# Patient Record
Sex: Male | Born: 1985 | Race: White | Hispanic: No | Marital: Married | Smoking: Never smoker
Health system: Southern US, Community
[De-identification: ages and names within clinical notes are randomized; demographics above are authoritative.]

## PROBLEM LIST (undated history)

## (undated) DIAGNOSIS — I4891 Unspecified atrial fibrillation: Secondary | ICD-10-CM

## (undated) DIAGNOSIS — S069X9A Unspecified intracranial injury with loss of consciousness of unspecified duration, initial encounter: Secondary | ICD-10-CM

## (undated) DIAGNOSIS — F4310 Post-traumatic stress disorder, unspecified: Secondary | ICD-10-CM

## (undated) HISTORY — PX: SHOULDER SURGERY: SHX246

---

## 2003-02-06 DIAGNOSIS — S069XAA Unspecified intracranial injury with loss of consciousness status unknown, initial encounter: Secondary | ICD-10-CM

## 2003-02-06 DIAGNOSIS — S069X9A Unspecified intracranial injury with loss of consciousness of unspecified duration, initial encounter: Secondary | ICD-10-CM

## 2003-02-06 HISTORY — DX: Unspecified intracranial injury with loss of consciousness of unspecified duration, initial encounter: S06.9X9A

## 2003-02-06 HISTORY — DX: Unspecified intracranial injury with loss of consciousness status unknown, initial encounter: S06.9XAA

## 2010-09-09 ENCOUNTER — Emergency Department (HOSPITAL_COMMUNITY): Payer: Self-pay

## 2010-09-09 ENCOUNTER — Encounter: Payer: Self-pay | Admitting: *Deleted

## 2010-09-09 ENCOUNTER — Emergency Department (HOSPITAL_COMMUNITY)
Admission: EM | Admit: 2010-09-09 | Discharge: 2010-09-10 | Disposition: A | Discharge status: Home / Self Care | Payer: Self-pay | Attending: Emergency Medicine | Admitting: Emergency Medicine

## 2010-09-09 DIAGNOSIS — Y9239 Other specified sports and athletic area as the place of occurrence of the external cause: Secondary | ICD-10-CM | POA: Insufficient documentation

## 2010-09-09 DIAGNOSIS — X58XXXA Exposure to other specified factors, initial encounter: Secondary | ICD-10-CM | POA: Insufficient documentation

## 2010-09-09 DIAGNOSIS — M67919 Unspecified disorder of synovium and tendon, unspecified shoulder: Secondary | ICD-10-CM | POA: Insufficient documentation

## 2010-09-09 DIAGNOSIS — M719 Bursopathy, unspecified: Secondary | ICD-10-CM | POA: Insufficient documentation

## 2010-09-09 NOTE — ED Notes (Signed)
Pt reports he has had 2 shoulder surgeries in [place], last in 2010, pt was playing baseball tonight and threw a pitch re-injuring his rt shoulder

## 2010-09-10 ENCOUNTER — Encounter (HOSPITAL_COMMUNITY): Payer: Self-pay | Admitting: Emergency Medicine

## 2010-09-10 MED ORDER — OXYCODONE-ACETAMINOPHEN 5-325 MG PO TABS
1.0000 | ORAL_TABLET | Freq: Once | ORAL | Status: AC
  Administered 2010-09-10: 1 via ORAL
  Filled 2010-09-10: qty 1

## 2010-09-10 MED ORDER — HYDROCODONE-ACETAMINOPHEN 5-325 MG PO TABS: ORAL_TABLET | ORAL | Status: AC

## 2010-09-10 MED ORDER — IBUPROFEN 800 MG PO TABS
800.0000 mg | ORAL_TABLET | Freq: Once | ORAL | Status: AC
  Administered 2010-09-10: 800 mg via ORAL
  Filled 2010-09-10: qty 1

## 2010-09-10 MED ORDER — IBUPROFEN 800 MG PO TABS: 800.0000 mg | ORAL_TABLET | Freq: Three times a day (TID) | ORAL | Status: AC

## 2010-09-10 NOTE — ED Provider Notes (Signed)
History     CSN: [id number] Arrival date & time: 09/09/2010 11:20 PM  Chief Complaint  Patient presents with  . Shoulder Injury   HPI Comments: Patient with hx of previous right rotator cuff and labral surgeries x 2.  C/o sudden onset of pain to the right shoulder tonight after he threw a softball.  States he felt a "tearing sensation" to the shoulder that felt similar to previous injury.  Pain is reproduced with abduction and rotation of the shoulder.  He denies numbness, weakness, chest pain or neck pain  Patient is a 25 y.o. male presenting with shoulder injury. The history is provided by the patient.  Shoulder Injury This is a recurrent problem. The problem occurs constantly. The problem has been unchanged. Associated symptoms include arthralgias. Pertinent negatives include no abdominal pain, coughing, fever, headaches, joint swelling, nausea, neck pain, numbness or weakness. The symptoms are aggravated by exertion (movement). He has tried nothing for the symptoms.  Shoulder Injury This is a recurrent problem. The problem occurs constantly. The problem has been unchanged. Pertinent negatives include no abdominal pain and no headaches. The symptoms are aggravated by exertion (movement). He has tried nothing for the symptoms.    History reviewed. No pertinent past medical history.  Past Surgical History  Procedure Date  . Shoulder surgery     History reviewed. No pertinent family history.  History  Substance Use Topics  . Smoking status: Not on file  . Smokeless tobacco: Current User    Types: Chew  . Alcohol Use: No      Review of Systems  Constitutional: Negative for fever and appetite change.  HENT: Negative for neck pain.   Eyes: Negative for visual disturbance.  Respiratory: Negative for cough and chest tightness.   Cardiovascular: Negative.   Gastrointestinal: Negative for nausea and abdominal pain.  Musculoskeletal: Positive for arthralgias. Negative for back pain  and joint swelling.  Skin: Negative.   Neurological: Negative for dizziness, weakness, numbness and headaches.  Hematological: Does not bruise/bleed easily.    Physical Exam  BP 120/78  Pulse 76  Temp(Src) 97.8 F (36.6 C) (Oral)  Resp 20  SpO2 100%  Physical Exam  Nursing note and vitals reviewed. Constitutional: He is oriented to person, place, and time. He appears well-developed and well-nourished. No distress.  HENT:  Head: Normocephalic and atraumatic.  Eyes: EOM are normal. Pupils are equal, round, and reactive to light.  Neck: Normal range of motion. Neck supple.  Cardiovascular: Normal rate, regular rhythm and normal heart sounds.   Pulmonary/Chest: Effort normal and breath sounds normal.  Musculoskeletal: He exhibits tenderness. He exhibits no edema.       Right shoulder: He exhibits decreased range of motion and tenderness. He exhibits no swelling, no effusion, no crepitus and normal pulse.       Arms: Lymphadenopathy:    He has no cervical adenopathy.  Neurological: He is alert and oriented to person, place, and time. No cranial nerve deficit. Coordination normal.  Skin: Skin is warm and dry.  Psychiatric: He has a normal mood and affect.    ED Course  Procedures  MDM   0100  I have reviewed the patient's x-ray results and discussed the findings with the patient.  He prefers to see ortho in [place].  Right arm was placed in a sling by nursing staff.  Neurovascular recheck intact.  Pain improved.  Likely rotator cuff re-injury and possible AC joint separation.      Tammy L. Triplett,  PA 09/10/10 0108 Medical screening examination/treatment/procedure(s) were performed by non-physician practitioner and as supervising physician I was immediately available for consultation/collaboration.  Nicoletta Dress. Colon Branch, MD 09/13/10 1206

## 2013-01-10 ENCOUNTER — Emergency Department (HOSPITAL_COMMUNITY)
Admission: EM | Admit: 2013-01-10 | Discharge: 2013-01-10 | Disposition: A | Discharge status: Home / Self Care | Payer: Self-pay | Attending: Emergency Medicine | Admitting: Emergency Medicine

## 2013-01-10 ENCOUNTER — Encounter (HOSPITAL_COMMUNITY): Payer: Self-pay | Admitting: Emergency Medicine

## 2013-01-10 ENCOUNTER — Emergency Department (HOSPITAL_COMMUNITY): Payer: Self-pay

## 2013-01-10 DIAGNOSIS — S93409A Sprain of unspecified ligament of unspecified ankle, initial encounter: Secondary | ICD-10-CM | POA: Insufficient documentation

## 2013-01-10 DIAGNOSIS — Z8679 Personal history of other diseases of the circulatory system: Secondary | ICD-10-CM | POA: Insufficient documentation

## 2013-01-10 DIAGNOSIS — X500XXA Overexertion from strenuous movement or load, initial encounter: Secondary | ICD-10-CM | POA: Insufficient documentation

## 2013-01-10 DIAGNOSIS — Y9301 Activity, walking, marching and hiking: Secondary | ICD-10-CM | POA: Insufficient documentation

## 2013-01-10 DIAGNOSIS — Y92009 Unspecified place in unspecified non-institutional (private) residence as the place of occurrence of the external cause: Secondary | ICD-10-CM | POA: Insufficient documentation

## 2013-01-10 DIAGNOSIS — Z91040 Latex allergy status: Secondary | ICD-10-CM | POA: Insufficient documentation

## 2013-01-10 DIAGNOSIS — Z87820 Personal history of traumatic brain injury: Secondary | ICD-10-CM | POA: Insufficient documentation

## 2013-01-10 HISTORY — DX: Unspecified intracranial injury with loss of consciousness of unspecified duration, initial encounter: S06.9X9A

## 2013-01-10 HISTORY — DX: Unspecified atrial fibrillation: I48.91

## 2013-01-10 MED ORDER — IBUPROFEN 600 MG PO TABS: 600.0000 mg | ORAL_TABLET | Freq: Four times a day (QID) | ORAL | Status: DC | PRN

## 2013-01-10 MED ORDER — HYDROCODONE-ACETAMINOPHEN 5-325 MG PO TABS
1.0000 | ORAL_TABLET | Freq: Once | ORAL | Status: AC
  Administered 2013-01-10: 1 via ORAL
  Filled 2013-01-10: qty 1

## 2013-01-10 MED ORDER — HYDROCODONE-ACETAMINOPHEN 5-325 MG PO TABS: 1.0000 | ORAL_TABLET | ORAL | Status: DC | PRN

## 2013-01-10 MED ORDER — IBUPROFEN 800 MG PO TABS
800.0000 mg | ORAL_TABLET | Freq: Once | ORAL | Status: AC
  Administered 2013-01-10: 800 mg via ORAL
  Filled 2013-01-10: qty 1

## 2013-01-10 NOTE — ED Notes (Signed)
Twisted right ankle stepping out of an outbuilding.

## 2013-01-10 NOTE — ED Provider Notes (Signed)
CSN: [id number]     Arrival date & time 01/10/13  2124 History   First MD Initiated Contact with Patient 01/10/13 2155     Chief Complaint  Patient presents with  . Ankle Pain   (Consider location/radiation/quality/duration/timing/severity/associated sxs/prior Treatment) HPI Comments: Nicolas Rose is a 27 y.o. Male presenting with  right ankle pain which occurred just prior to arrival when he rolled his ankle when he missed a stepping stone walking out of his outbuilding.  Pain is aching, constant and worse with palpation, movement and weight bearing.  The patient was able to weight bear immediately after the event.  There is no radiation of pain and the patient denies numbness distal to the injury site.  He has had no treatment prior to arrival.  He has a history of 2 prior ankle fractures in this joint.     The history is provided by the patient and the spouse.    Past Medical History  Diagnosis Date  . A-fib   . TBI (traumatic brain injury) 2005   Past Surgical History  Procedure Laterality Date  . Shoulder surgery     No family history on file. History  Substance Use Topics  . Smoking status: Never Smoker   . Smokeless tobacco: Current User    Types: Chew  . Alcohol Use: No    Review of Systems  Musculoskeletal: Positive for arthralgias and joint swelling.  Skin: Negative for wound.  Neurological: Negative for weakness and numbness.    Allergies  Phenergan and Latex  Home Medications   Current Outpatient Rx  Name  Route  Sig  Dispense  Refill  . HYDROcodone-acetaminophen (NORCO/VICODIN) 5-325 MG per tablet   Oral   Take 1 tablet by mouth every 4 (four) hours as needed for moderate pain.   15 tablet   0   . ibuprofen (ADVIL,MOTRIN) 600 MG tablet   Oral   Take 1 tablet (600 mg total) by mouth every 6 (six) hours as needed.   30 tablet   0    BP 112/78  Pulse 118  Temp(Src) 97.9 F (36.6 C) (Oral)  Resp 20  Ht 5\' 4"  (1.626 m)  Wt 172 lb (78.019 kg)   BMI 29.51 kg/m2  SpO2 98% Physical Exam  Nursing note and vitals reviewed. Constitutional: He appears well-developed and well-nourished.  HENT:  Head: Normocephalic.  Cardiovascular: Normal rate and intact distal pulses.  Exam reveals no decreased pulses.   Pulses:      Dorsalis pedis pulses are 2+ on the right side, and 2+ on the left side.       Posterior tibial pulses are 2+ on the right side, and 2+ on the left side.  Musculoskeletal: He exhibits edema and tenderness.       Right ankle: He exhibits decreased range of motion, swelling and ecchymosis. He exhibits normal pulse. Tenderness. Lateral malleolus tenderness found. No head of 5th metatarsal and no proximal fibula tenderness found. Achilles tendon normal.  Neurological: He is alert. No sensory deficit.  Skin: Skin is warm, dry and intact.    ED Course  Procedures (including critical care time) Labs Review Labs Reviewed - No data to display Imaging Review Dg Ankle Complete Right  01/10/2013   CLINICAL DATA:  Lateral right ankle pain and swelling following an injury.  EXAM: RIGHT ANKLE - COMPLETE 3+ VIEW  COMPARISON:  None.  FINDINGS: Avulsion fracture fragments adjacent to the distal aspect of the lateral malleolus. Mild diffuse soft tissue swelling.  No effusion is seen.  IMPRESSION: Probable old fracture fragments adjacent to the distal lateral malleolus. Acute fracture fragments are less likely   Electronically Signed   By: Gordan Payment M.D.   On: 01/10/2013 22:12    EKG Interpretation   None       MDM   1. Ankle sprain and strain, right, initial encounter    Patients labs and/or radiological studies were viewed and considered during the medical decision making and disposition process. ASO,  RICE.  Prescribed ibuprofen, hydrocodone.  [facility]  Currently under care for rotator cuff injury.  Encouraged recheck with his orthopedist if not improving over the next 10 days.    Burgess Amor,  PA-C 01/10/13 2253

## 2013-01-13 NOTE — ED Provider Notes (Signed)
Medical screening examination/treatment/procedure(s) were performed by non-physician practitioner and as supervising physician I was immediately available for consultation/collaboration.    Jaiven Graveline D Matilde Pottenger, MD 01/13/13 0119 

## 2014-04-05 ENCOUNTER — Emergency Department (HOSPITAL_COMMUNITY)
Admission: EM | Admit: 2014-04-05 | Discharge: 2014-04-05 | Disposition: A | Discharge status: Home / Self Care | Payer: Non-veteran care | Attending: Emergency Medicine | Admitting: Emergency Medicine

## 2014-04-05 ENCOUNTER — Encounter (HOSPITAL_COMMUNITY): Payer: Self-pay | Admitting: *Deleted

## 2014-04-05 DIAGNOSIS — Z8659 Personal history of other mental and behavioral disorders: Secondary | ICD-10-CM | POA: Insufficient documentation

## 2014-04-05 DIAGNOSIS — M79641 Pain in right hand: Secondary | ICD-10-CM | POA: Diagnosis present

## 2014-04-05 DIAGNOSIS — Z8679 Personal history of other diseases of the circulatory system: Secondary | ICD-10-CM | POA: Insufficient documentation

## 2014-04-05 DIAGNOSIS — Z87820 Personal history of traumatic brain injury: Secondary | ICD-10-CM | POA: Diagnosis not present

## 2014-04-05 DIAGNOSIS — Z91040 Latex allergy status: Secondary | ICD-10-CM | POA: Diagnosis not present

## 2014-04-05 DIAGNOSIS — M779 Enthesopathy, unspecified: Secondary | ICD-10-CM | POA: Insufficient documentation

## 2014-04-05 DIAGNOSIS — R002 Palpitations: Secondary | ICD-10-CM | POA: Diagnosis not present

## 2014-04-05 HISTORY — DX: Post-traumatic stress disorder, unspecified: F43.10

## 2014-04-05 MED ORDER — DEXAMETHASONE 6 MG PO TABS: ORAL_TABLET | ORAL | Status: DC

## 2014-04-05 MED ORDER — MELOXICAM 15 MG PO TABS: 15.0000 mg | ORAL_TABLET | Freq: Every day | ORAL | Status: DC

## 2014-04-05 MED ORDER — HYDROCODONE-ACETAMINOPHEN 5-325 MG PO TABS: 1.0000 | ORAL_TABLET | ORAL | Status: DC | PRN

## 2014-04-05 MED ORDER — KETOROLAC TROMETHAMINE 10 MG PO TABS
10.0000 mg | ORAL_TABLET | Freq: Once | ORAL | Status: AC
  Administered 2014-04-05: 10 mg via ORAL
  Filled 2014-04-05: qty 1

## 2014-04-05 NOTE — ED Notes (Signed)
PA at bedside.

## 2014-04-05 NOTE — Discharge Instructions (Signed)
Please use the wrist splint for the next 7 to 10 days. Please use medications as suggested. Norco may cause drowsiness, use with caution. Please see the hand specialist listed above, or the hand specialist of your choice if not resolving. Your exam is consistent with tendonitis. But the possibility of carpal tunnel is also present. Please see the specialist if not improving. Tendinitis Tendinitis is swelling and inflammation of the tendons. Tendons are band-like tissues that connect muscle to bone. Tendinitis commonly occurs in the:   Shoulders (rotator cuff).  Heels (Achilles tendon).  Elbows (triceps tendon). CAUSES Tendinitis is usually caused by overusing the tendon, muscles, and joints involved. When the tissue surrounding a tendon (synovium) becomes inflamed, it is called tenosynovitis. Tendinitis commonly develops in people whose jobs require repetitive motions. SYMPTOMS  Pain.  Tenderness.  Mild swelling. DIAGNOSIS Tendinitis is usually diagnosed by physical exam. Your health care provider may also order X-rays or other imaging tests. TREATMENT Your health care provider may recommend certain medicines or exercises for your treatment. HOME CARE INSTRUCTIONS   Use a sling or splint for as long as directed by your health care provider until the pain decreases.  Put ice on the injured area.  Put ice in a plastic bag.  Place a towel between your skin and the bag.  Leave the ice on for 15-20 minutes, 3-4 times a day, or as directed by your health care provider.  Avoid using the limb while the tendon is painful. Perform gentle range of motion exercises only as directed by your health care provider. Stop exercises if pain or discomfort increase, unless directed otherwise by your health care provider.  Only take over-the-counter or prescription medicines for pain, discomfort, or fever as directed by your health care provider. SEEK MEDICAL CARE IF:   Your pain and swelling  increase.  You develop new, unexplained symptoms, especially increased numbness in the hands. MAKE SURE YOU:   Understand these instructions.  Will watch your condition.  Will get help right away if you are not doing well or get worse. Document Released: 01/20/2000 Document Revised: 06/08/2013 Document Reviewed: 04/10/2010 [facility] Patient Information 2015 [employer], [place]. This information is not intended to replace advice given to you by your health care provider. Make sure you discuss any questions you have with your health care provider.

## 2014-04-05 NOTE — ED Provider Notes (Signed)
CSN: [id number]     Arrival date & time 04/05/14  1142 History  This chart was scribed for non-physician practitioner Ivery QualeHobson Cassadee Pautz, PA-C, working with Benny LennertJoseph L Zammit, MD by Littie Deedsichard Sun, ED Scribe. This patient was seen in room APFT23/APFT23 and the patient's care was started at 2:39 PM.      Chief Complaint  Patient presents with  . Hand Pain   The history is provided by the patient. No language interpreter was used.   HPI Comments: Nicolas Rose is a 29 y.o. male who presents to the Emergency Department complaining of gradual onset right hand pain that started earlier today while he was using a chain saw to cut wood. Patient states he was cutting wood for about 5-10 minutes, then took a break for about 10 minutes before he started cutting wood again; then, he started noticing the pain. He also reports redness, swelling and warmth to the area. Patient initially thought he had a reaction to the pine that he was cutting, but he did not notice any symptoms to anywhere else in his body. He also could not find any bug bites on his body. The pain is worsened with making a fist and straightening his fingers. Patient states he was not doing a lot of work with his hands recently before symptoms started. He denies having similar symptoms previously.   Past Medical History  Diagnosis Date  . A-fib   . TBI (traumatic brain injury) 2005  . PTSD (post-traumatic stress disorder)    Past Surgical History  Procedure Laterality Date  . Shoulder surgery     History reviewed. No pertinent family history. History  Substance Use Topics  . Smoking status: Never Smoker   . Smokeless tobacco: Current User    Types: Chew  . Alcohol Use: No    Review of Systems  Cardiovascular: Positive for palpitations.  Musculoskeletal: Positive for arthralgias and neck pain.  Skin: Negative for rash.  All other systems reviewed and are negative.     Allergies  Phenergan and Latex  Home Medications   Prior to  Admission medications   Medication Sig Start Date End Date Taking? Authorizing Provider  HYDROcodone-acetaminophen (NORCO/VICODIN) 5-325 MG per tablet Take 1 tablet by mouth every 4 (four) hours as needed for moderate pain. Patient not taking: Reported on 04/05/2014 01/10/13   Burgess AmorJulie Idol, PA-C  ibuprofen (ADVIL,MOTRIN) 600 MG tablet Take 1 tablet (600 mg total) by mouth every 6 (six) hours as needed. Patient not taking: Reported on 04/05/2014 01/10/13   Burgess AmorJulie Idol, PA-C   BP 107/74 mmHg  Pulse 101  Temp(Src) 98.4 F (36.9 C) (Oral)  Resp 20  Ht 5\' 4"  (1.626 m)  Wt 190 lb (86.183 kg)  BMI 32.60 kg/m2  SpO2 98% Physical Exam  Constitutional: He is oriented to person, place, and time. He appears well-developed and well-nourished. No distress.  HENT:  Head: Normocephalic and atraumatic.  Mouth/Throat: Oropharynx is clear and moist. No oropharyngeal exudate.  Eyes: Pupils are equal, round, and reactive to light.  Neck: Neck supple.  Cardiovascular: Normal rate, regular rhythm and normal heart sounds.   No murmur heard. Right upper extremity: capillary refill < 2 seconds.  Pulmonary/Chest: Effort normal and breath sounds normal. No respiratory distress. He has no wheezes. He has no rales.  Musculoskeletal: Normal range of motion. He exhibits no edema.  Soreness of dorsum of hand on right. Full ROM of the wrist. No deformity of the wrist or forearm. No hot areas of the  RUE. No lesions in the web space of the right hand. No atrophy of the thenar eminence.  Neurological: He is alert and oriented to person, place, and time. No cranial nerve deficit.  Skin: Skin is warm and dry. No rash noted.  Psychiatric: He has a normal mood and affect. His behavior is normal.  Nursing note and vitals reviewed.   ED Course  Procedures  DIAGNOSTIC STUDIES: Oxygen Saturation is 98% on room air, normal by my interpretation.    COORDINATION OF CARE: 2:46 PM-Discussed treatment plan which includes wrist  splint, anti-inflammatories and Decatron with pt at bedside and pt agreed to plan.    Labs Review Labs Reviewed - No data to display  Imaging Review No results found.   EKG Interpretation None      MDM  Exam favors tendonitis of the right upper extremity. No gross neurovascular deficit to be reported.  Rx for decadron, norco, and ibuprofen given to the patient.   Final diagnoses:  Tendonitis    *I have reviewed nursing notes, vital signs, and all appropriate lab and imaging results for this patient.**  **I personally performed the services described in this documentation, which was scribed in my presence. The recorded information has been reviewed and is accurate.Kathie Dike, PA-C 04/06/14 2118  Benny Lennert, MD 04/08/14 [phone]

## 2014-04-05 NOTE — ED Notes (Signed)
Rt hand became swollen when using chain saw

## 2016-08-20 ENCOUNTER — Ambulatory Visit (INDEPENDENT_AMBULATORY_CARE_PROVIDER_SITE_OTHER): Payer: Worker's Compensation | Admitting: Family

## 2016-08-20 ENCOUNTER — Ambulatory Visit (INDEPENDENT_AMBULATORY_CARE_PROVIDER_SITE_OTHER): Payer: Worker's Compensation

## 2016-08-20 VITALS — BP 100/65 | HR 74 | Temp 96.7°F | Ht 63.5 in | Wt 187.0 lb

## 2016-08-20 DIAGNOSIS — M25511 Pain in right shoulder: Secondary | ICD-10-CM

## 2016-08-20 MED ORDER — IBUPROFEN 800 MG PO TABS: 800.0000 mg | ORAL_TABLET | Freq: Three times a day (TID) | ORAL | 1 refills | Status: DC | PRN

## 2016-08-20 NOTE — Patient Instructions (Signed)

## 2016-08-20 NOTE — Progress Notes (Signed)
   Subjective:    Patient ID: Nicolas Rose, male    DOB: 11/16/1985, 31 y.o.   MRN: [id number]  HPI Pt presents to the office today with a Workers Comp injury for [employer] that occurred today 08/20/16 around 8:15 AM. Pt states he was pushing a bundle of foam that is approx 400-500 lb and felt something "pop" in that right shoulder. States he is having constant aching pain of 8 out 10.   States he can not extend his arm out and it hurts to raise a [facility] [employer] [facility] [employer] can to his mouth.    Review of Systems  Musculoskeletal: Positive for arthralgias.       Right shoulder  All other systems reviewed and are negative.      Objective:   Physical Exam  Constitutional: He is oriented to person, place, and time. He appears well-developed and well-nourished.  Cardiovascular: Normal rate, regular rhythm, normal heart sounds and intact distal pulses.   Pulmonary/Chest: Effort normal and breath sounds normal.  Musculoskeletal: He exhibits tenderness.  Right shoulder pain with abduction, pt unable to lift greater 90degrees, tenderness in anterior shoulder with palpation   Neurological: He is alert and oriented to person, place, and time.   Shoulder x-ray- negative    BP 100/65 (BP Location: Left Arm, Patient Position: Sitting, Cuff Size: Normal)   Pulse 74   Temp (!) 96.7 F (35.9 C) (Oral)   Ht 5' 3.5" (1.613 m)   Wt 187 lb (84.8 kg)   BMI 32.61 kg/m      Assessment & Plan:  1. Acute pain of right shoulder Rest Ice We will schedule MRI to rule out Rotator cuff tear Will put pt out of work until MRI - DG Shoulder Right; Future - MR Shoulder Right Wo Contrast; Future - ibuprofen (ADVIL,MOTRIN) 800 MG tablet; Take 1 tablet (800 mg total) by mouth every 8 (eight) hours as needed.  Dispense: 60 tablet; Refill: 1    Jannifer Rodneyhristy Yamin Swingler, FNP

## 2016-09-03 ENCOUNTER — Telehealth: Payer: Self-pay | Admitting: Family

## 2016-09-03 NOTE — Telephone Encounter (Signed)
Spoke with patient- advised that his MRI has been approved, and he should hear from [facility] with the appointment information per claims adjuster Freda [facility]Pauline [facility].  Asked patient to call back Thursday 09/06/16 if he has not gotten an appointment yet.

## 2016-09-03 NOTE — Telephone Encounter (Signed)
For Amy

## 2016-09-13 ENCOUNTER — Other Ambulatory Visit: Payer: Self-pay | Admitting: Family

## 2016-09-14 ENCOUNTER — Other Ambulatory Visit: Payer: Self-pay | Admitting: Family

## 2016-09-14 NOTE — Progress Notes (Signed)
MRI of shoulder is negative for tear. Is pt still having problems/pain, decrease ROM in shoulder?

## 2016-09-17 ENCOUNTER — Encounter: Payer: Self-pay | Admitting: *Deleted

## 2016-09-17 ENCOUNTER — Encounter: Payer: Self-pay | Admitting: Family

## 2016-09-17 ENCOUNTER — Ambulatory Visit (INDEPENDENT_AMBULATORY_CARE_PROVIDER_SITE_OTHER): Payer: Worker's Compensation | Admitting: Family

## 2016-09-17 VITALS — BP 112/79 | HR 92 | Temp 97.2°F | Ht 63.5 in | Wt 190.8 lb

## 2016-09-17 DIAGNOSIS — M7581 Other shoulder lesions, right shoulder: Secondary | ICD-10-CM

## 2016-09-17 DIAGNOSIS — M25511 Pain in right shoulder: Secondary | ICD-10-CM

## 2016-09-17 MED ORDER — PREDNISONE 10 MG (21) PO TBPK: ORAL_TABLET | ORAL | 0 refills | Status: DC

## 2016-09-17 MED ORDER — IBUPROFEN 800 MG PO TABS: 800.0000 mg | ORAL_TABLET | Freq: Three times a day (TID) | ORAL | 1 refills | Status: DC | PRN

## 2016-09-17 NOTE — Progress Notes (Signed)
Aware of results per letter mailed.

## 2016-09-17 NOTE — Progress Notes (Signed)
   Subjective:    Patient ID: Nicolas Rose, male    DOB: 10/16/1985, 31 y.o.   MRN: [id number]  HPI Pt presents to the office today to recheck right shoulder after a  Worker Comp for [employer] that occurred on 08/20/16. PT had a MRI on 09/06/16 that showed mild fraying of the rotator cuff without focal tear and reactive fluid versus mild bursitis of the subacromial subdeltoid. d  Pt reports intermittent stabbing  pain of 6 out 10 and is unable to lift arm above head. Pt has been out of work since injury, but work is offering pt Light Duty that he can do until ortho clears pt.    Review of Systems  Musculoskeletal: Positive for arthralgias.       Right shoulder pain  All other systems reviewed and are negative.      Objective:   Physical Exam  Constitutional: He is oriented to person, place, and time. He appears well-developed and well-nourished. No distress.  HENT:  Head: Normocephalic.  Eyes: Right eye exhibits no discharge. Left eye exhibits no discharge.  Cardiovascular: Normal rate, regular rhythm, normal heart sounds and intact distal pulses.   No murmur heard. Pulmonary/Chest: Effort normal and breath sounds normal. No respiratory distress. He has no wheezes.  Abdominal: Soft. Bowel sounds are normal. He exhibits no distension. There is no tenderness.  Musculoskeletal: He exhibits tenderness. He exhibits no edema.  Limited ROM with abduction greater than 90 degrees  Neurological: He is alert and oriented to person, place, and time.  Skin: Skin is warm and dry. No rash noted. No erythema.  Psychiatric: He has a normal mood and affect. His behavior is normal. Judgment and thought content normal.  Vitals reviewed.     BP 112/79   Pulse 92   Temp (!) 97.2 F (36.2 C) (Oral)   Ht 5' 3.5" (1.613 m)   Wt 190 lb 12.8 oz (86.5 kg)   BMI 33.27 kg/m      Assessment & Plan:  1. Acute pain of right shoulder - predniSONE (STERAPRED UNI-PAK 21 TAB) 10 MG (21) TBPK tablet;  Use as directed  Dispense: 21 tablet; Refill: 0 - Ambulatory referral to Orthopedic Surgery - ibuprofen (ADVIL,MOTRIN) 800 MG tablet; Take 1 tablet (800 mg total) by mouth every 8 (eight) hours as needed.  Dispense: 60 tablet; Refill: 1  2. Tendinitis of right rotator cuff - predniSONE (STERAPRED UNI-PAK 21 TAB) 10 MG (21) TBPK tablet; Use as directed  Dispense: 21 tablet; Refill: 0 - Ambulatory referral to Orthopedic Surgery - ibuprofen (ADVIL,MOTRIN) 800 MG tablet; Take 1 tablet (800 mg total) by mouth every 8 (eight) hours as needed.  Dispense: 60 tablet; Refill: 1  Rest Ice  Prednisone and Motrin prn  Will do referral to Ortho- They will clear pt back to full duty Pt on light duty until Ortho appt, pt to follow up with me in 2 weeks if he has not set up Ortho appt  Jannifer Rodneyhristy Rayden Dock, FNP

## 2016-09-17 NOTE — Patient Instructions (Signed)
Rotator Cuff Tendinitis Rotator cuff tendinitis is inflammation of the tough, cord-like bands that connect muscle to bone (tendons) in the rotator cuff. The rotator cuff includes all of the muscles and tendons that connect the arm to the shoulder. The rotator cuff holds the head of the upper arm bone (humerus) in the cup (fossa) of the shoulder blade (scapula). This condition can lead to a long-lasting (chronic) tear. The tear may be partial or complete. What are the causes? This condition is usually caused by overusing the rotator cuff. What increases the risk? This condition is more likely to develop in athletes and workers who frequently use their shoulder or reach over their heads. This can include activities such as:  Tennis.  Baseball or softball.  Swimming.  Construction work.  Painting.  What are the signs or symptoms? Symptoms of this condition include:  Pain spreading (radiating) from the shoulder to the upper arm.  Swelling and tenderness in front of the shoulder.  Pain when reaching, pulling, or lifting the arm above the head.  Pain when lowering the arm from above the head.  Minor pain in the shoulder when resting.  Increased pain in the shoulder at night.  Difficulty placing the arm behind the back.  How is this diagnosed? This condition is diagnosed with a medical history and physical exam. Tests may also be done, including:  X-rays.  MRI.  Ultrasounds.  CT or MR arthrogram. During this test, a contrast material is injected and then images are taken.  How is this treated? Treatment for this condition depends on the severity of the condition. In less severe cases, treatment may include:  Rest. This may be done with a sling that holds the shoulder still (immobilization). Your health care provider may also recommend avoiding activities that involve lifting your arm over your head.  Icing the shoulder.  Anti-inflammatory medicines, such as aspirin or  ibuprofen.  In more severe cases, treatment may include:  Physical therapy.  Steroid injections.  Surgery.  Follow these instructions at home: If you have a sling:  Wear the sling as told by your health care provider. Remove it only as told by your health care provider.  Loosen the sling if your fingers tingle, become numb, or turn cold and blue.  Keep the sling clean.  If the sling is not waterproof, do not let it get wet. Remove it, if allowed, or cover it with a watertight covering when you take a bath or shower. Managing pain, stiffness, and swelling  If directed, put ice on the injured area. ? If you have a removable sling, remove it as told by your health care provider. ? Put ice in a plastic bag. ? Place a towel between your skin and the bag. ? Leave the ice on for 20 minutes, 2-3 times a day.  Move your fingers often to avoid stiffness and to lessen swelling.  Raise (elevate) the injured area above the level of your heart while you are lying down.  Find a comfortable sleeping position or sleep on a recliner, if available. Driving  Do not drive or use heavy machinery while taking prescription pain medicine.  Ask your health care provider when it is safe to drive if you have a sling on your arm. Activity  Rest your shoulder as told by your health care provider.  Return to your normal activities as told by your health care provider. Ask your health care provider what activities are safe for you.  Do any   exercises or stretches as told by your health care provider.  If you do repetitive overhead tasks, take small breaks in between and include stretching exercises as told by your health care provider. General instructions  Do not use any products that contain nicotine or tobacco, such as cigarettes and e-cigarettes. These can delay healing. If you need help quitting, ask your health care provider.  Take over-the-counter and prescription medicines only as told by  your health care provider.  Keep all follow-up visits as told by your health care provider. This is important. Contact a health care provider if:  Your pain gets worse.  You have new pain in your arm, hands, or fingers.  Your pain is not relieved with medicine or does not get better after 6 weeks of treatment.  You have cracking sensations when moving your shoulder in certain directions.  You hear a snapping sound after using your shoulder, followed by severe pain and weakness. Get help right away if:  Your arm, hand, or fingers are numb or tingling.  Your arm, hand, or fingers are swollen or painful or they turn white or blue. Summary  Rotator cuff tendinitis is inflammation of the tough, cord-like bands that connect muscle to bone (tendons) in the rotator cuff.  This condition is usually caused by overusing the rotator cuff, which includes all of the muscles and tendons that connect the arm to the shoulder.  This condition is more likely to develop in athletes and workers who frequently use their shoulder or reach over their heads.  Treatment generally includes rest, anti-inflammatory medicines, and icing. In some cases, physical therapy and steroid injections may be needed. In severe cases, surgery may be needed. This information is not intended to replace advice given to you by your health care provider. Make sure you discuss any questions you have with your health care provider. Document Released: 04/14/2003 Document Revised: 01/09/2016 Document Reviewed: 01/09/2016 Elsevier Interactive Patient Education  2017 [employer]  

## 2020-05-05 ENCOUNTER — Other Ambulatory Visit: Payer: Self-pay | Admitting: Family Medicine

## 2020-05-05 ENCOUNTER — Ambulatory Visit: Payer: Self-pay

## 2020-05-05 ENCOUNTER — Other Ambulatory Visit: Payer: Self-pay

## 2020-05-05 DIAGNOSIS — M25521 Pain in right elbow: Secondary | ICD-10-CM

## 2020-05-05 DIAGNOSIS — M25511 Pain in right shoulder: Secondary | ICD-10-CM

## 2020-05-23 ENCOUNTER — Other Ambulatory Visit: Payer: Self-pay | Admitting: Orthopedic Surgery

## 2020-05-23 DIAGNOSIS — M25511 Pain in right shoulder: Secondary | ICD-10-CM

## 2020-06-15 ENCOUNTER — Ambulatory Visit
Admission: RE | Admit: 2020-06-15 | Discharge: 2020-06-15 | Disposition: A | Discharge status: Home / Self Care | Payer: Worker's Compensation | Source: Ambulatory Visit | Attending: Orthopedic Surgery | Admitting: Orthopedic Surgery

## 2020-06-15 DIAGNOSIS — M25511 Pain in right shoulder: Secondary | ICD-10-CM

## 2020-06-15 MED ORDER — IOPAMIDOL (ISOVUE-M 200) INJECTION 41%
15.0000 mL | Freq: Once | INTRAMUSCULAR | Status: AC
  Administered 2020-06-15: 15 mL via INTRA_ARTICULAR

## 2022-01-23 IMAGING — DX DG ELBOW COMPLETE 3+V*R*
4 series · 4 of 4 positions shown · non-contrast
Comparison: None.

CLINICAL DATA: 34-year-old male status post hyperextension injury 4
days ago. Persistent pain.

EXAM:
RIGHT ELBOW - COMPLETE 3+ VIEW

[elbow ap]
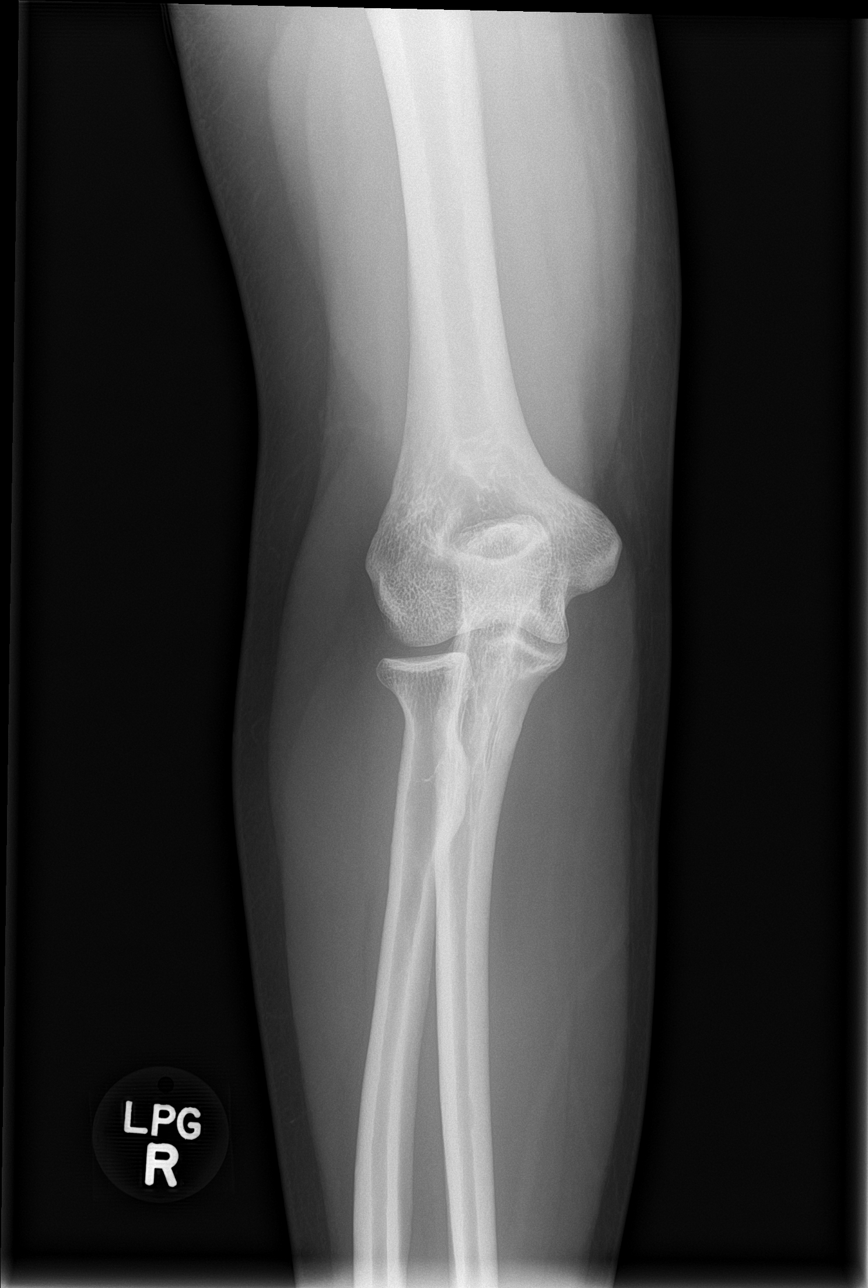

[elbow obl (1 of 2)]
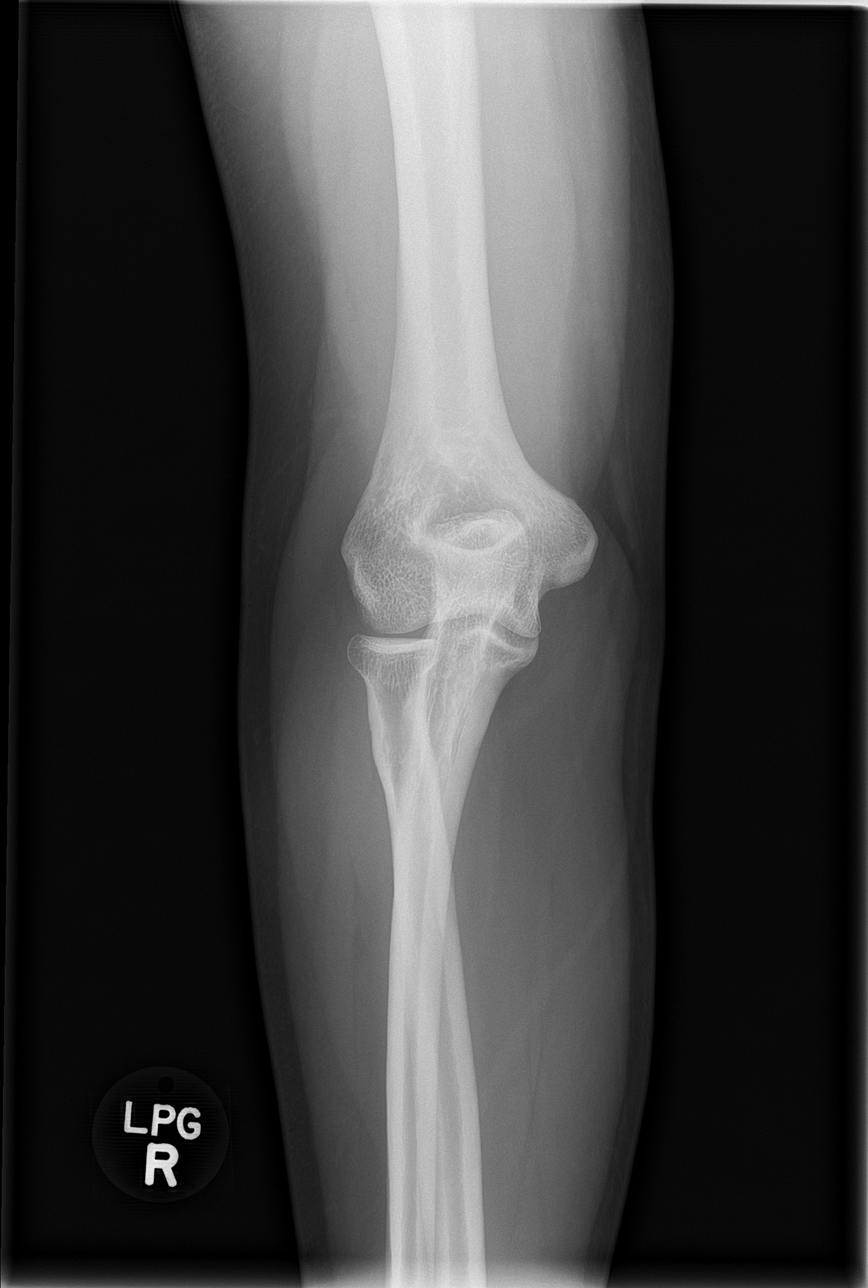

[elbow obl (2 of 2)]
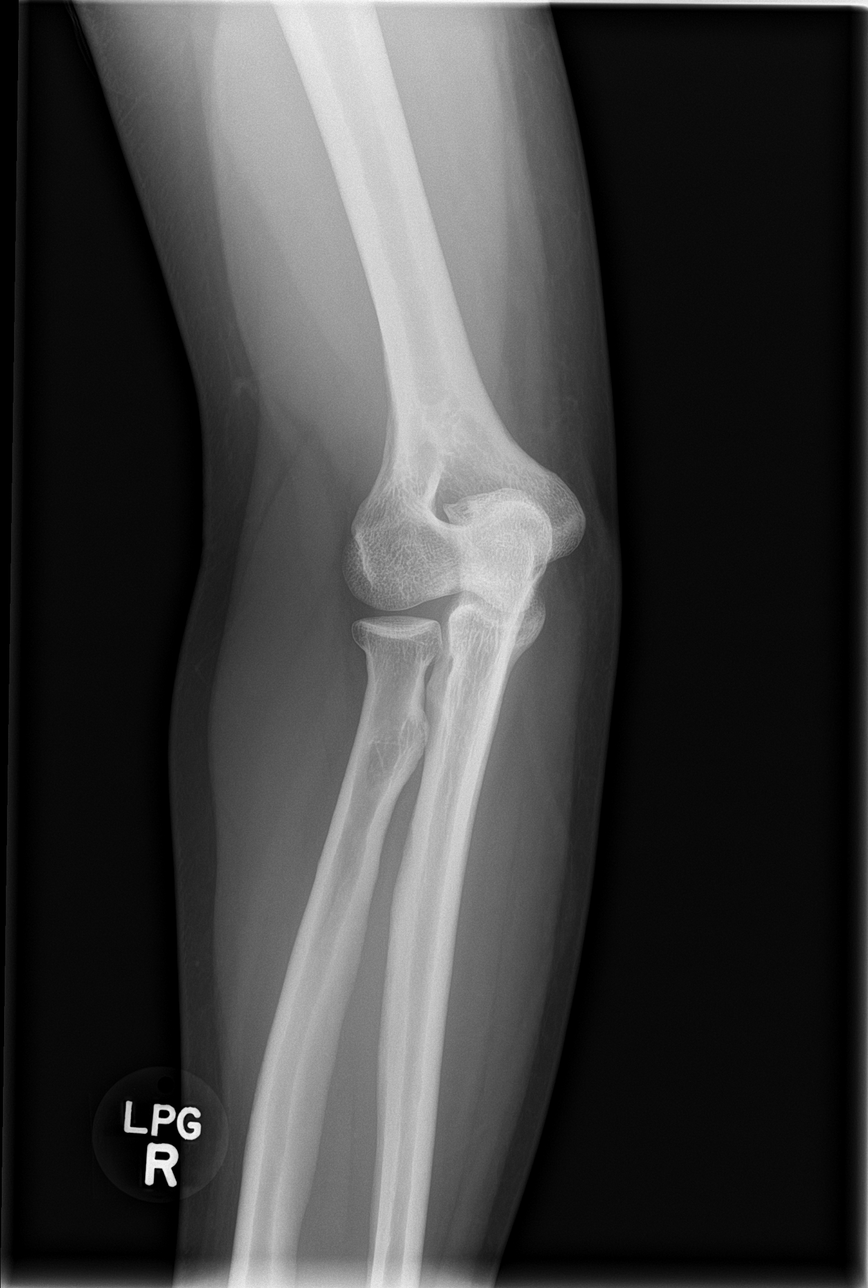

[elbow lat]
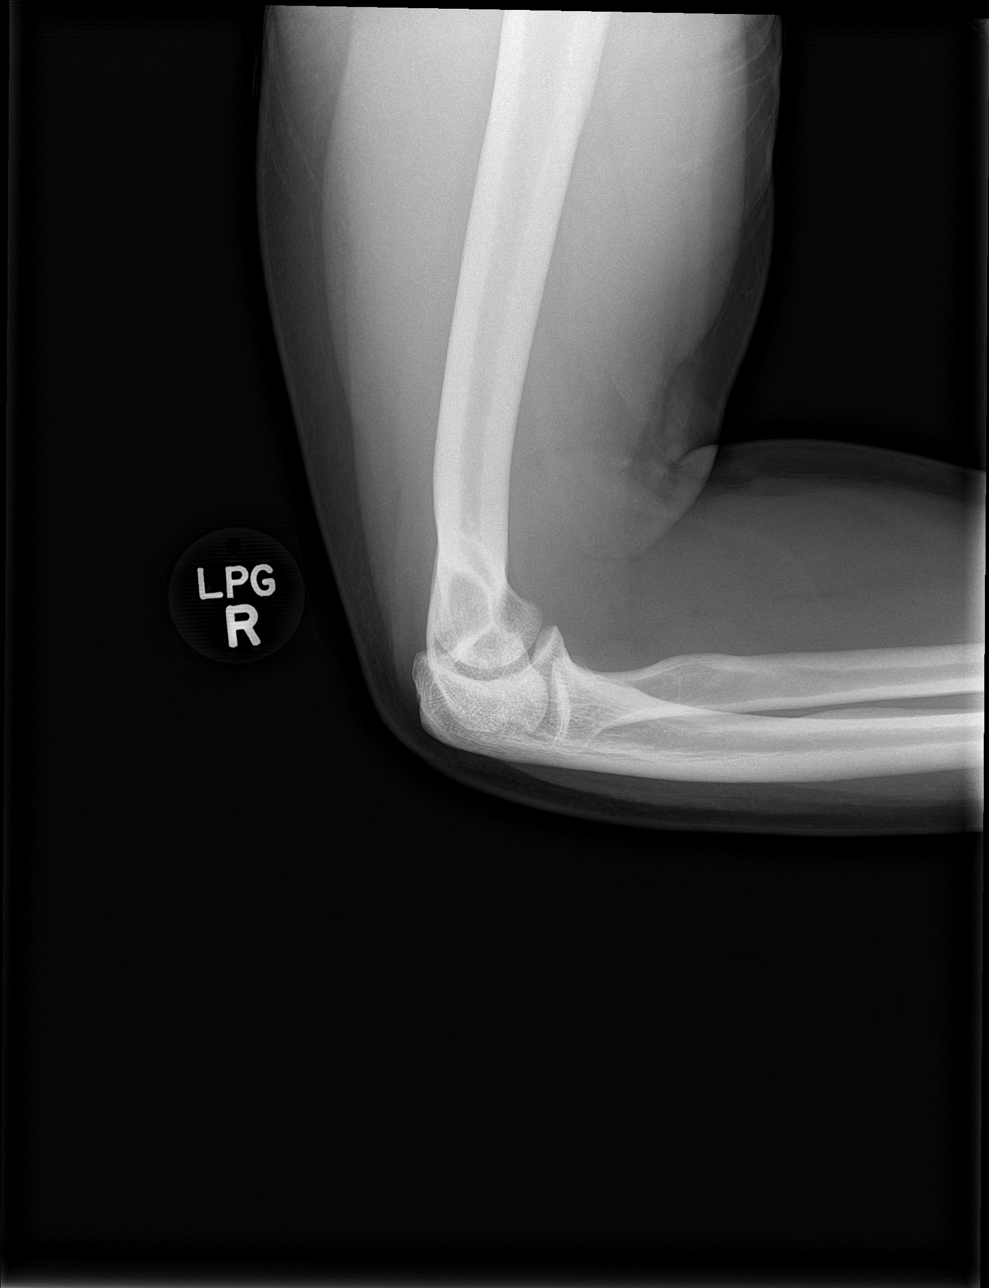

[4 of 4 positions shown; findings below may reference images not displayed]

FINDINGS: Bone mineralization is within normal limits. There is no evidence of
fracture, dislocation, or joint effusion. There is no evidence of
arthropathy or other focal bone abnormality. Soft tissues are
unremarkable.
IMPRESSION: Negative.

## 2022-05-01 ENCOUNTER — Encounter: Payer: Self-pay | Admitting: *Deleted

## 2022-05-02 ENCOUNTER — Other Ambulatory Visit: Payer: Self-pay | Admitting: Internal Medicine

## 2022-05-02 ENCOUNTER — Ambulatory Visit (INDEPENDENT_AMBULATORY_CARE_PROVIDER_SITE_OTHER): Payer: Commercial Managed Care - PPO

## 2022-05-02 ENCOUNTER — Telehealth: Payer: Self-pay | Admitting: Internal Medicine

## 2022-05-02 ENCOUNTER — Encounter: Payer: Self-pay | Admitting: Internal Medicine

## 2022-05-02 ENCOUNTER — Ambulatory Visit: Payer: Commercial Managed Care - PPO | Attending: Internal Medicine | Admitting: Internal Medicine

## 2022-05-02 VITALS — BP 112/79 | HR 86 | Ht 63.0 in | Wt 204.0 lb

## 2022-05-02 DIAGNOSIS — R002 Palpitations: Secondary | ICD-10-CM

## 2022-05-02 NOTE — Patient Instructions (Addendum)
Medication Instructions:  Your physician recommends that you continue on your current medications as directed. Please refer to the Current Medication list given to you today.  Labwork: none  Testing/Procedures: Your physician has recommended that you wear a Zio monitor.   This monitor is a medical device that records the heart's electrical activity. Doctors most often use these monitors to diagnose arrhythmias. Arrhythmias are problems with the speed or rhythm of the heartbeat. The monitor is a small device applied to your chest. You can wear one while you do your normal daily activities. While wearing this monitor if you have any symptoms to push the button and record what you felt. Once you have worn this monitor for the period of time provider prescribed (for 7 days), you will return the monitor device in the postage paid box. Once it is returned they will download the data collected and provide us with a report which the provider will then review and we will call you with those results. Important tips:  Avoid showering during the first 24 hours of wearing the monitor. Avoid excessive sweating to help maximize wear time. Do not submerge the device, no hot tubs, and no swimming pools. Keep any lotions or oils away from the patch. After 24 hours you may shower with the patch on. Take brief showers with your back facing the shower head.  Do not remove patch once it has been placed because that will interrupt data and decrease adhesive wear time. Push the button when you have any symptoms and write down what you were feeling. Once you have completed wearing your monitor, remove and place into box which has postage paid and place in your outgoing mailbox.  If for some reason you have misplaced your box then call our office and we can provide another box and/or mail it off for you.  Follow-Up: Your physician recommends that you schedule a follow-up appointment in: as needed  Any Other Special  Instructions Will Be Listed Below (If Applicable).  If you need a refill on your cardiac medications before your next appointment, please call your pharmacy. 

## 2022-05-02 NOTE — Progress Notes (Signed)
Cardiology Office Note  Date: 05/02/2022   ID: Nicolas Rose, DOB [birth year], MRN [id number]  PCP:  [facility]  Cardiologist:  None Electrophysiologist:  None   Reason for Office Visit: Evaluation of palpitations at the request of Dr. Quillian Quince   History of Present Illness: Nicolas Rose is a 37 y.o. male with no PMH was referred to cardiology clinic for evaluation of palpitations.  Patient reported that he has been having palpitations for couple of years, occurs multiple times in a week, lasts for 5 to 10 minutes and resolves with deep breaths. No other symptoms of chest pain, dizziness, syncope, LE swelling. Does not drink coffee, energy drinks or take any herbal supplements. He works in the EMS. Denies smoking cigarettes, alcohol use and illicit drug abuse. Unclear family history of CAD or arrhythmias as his parents prefer to keep their medical information private. He was diagnosed with PTSD 20 years ago after serving in the [employer] but does not think the palpitations correlate with the PTSD trigger.  Past Medical History:  Diagnosis Date   A-fib [facility])    PTSD (post-traumatic stress disorder)    TBI (traumatic brain injury) ([place]) 2005    Past Surgical History:  Procedure Laterality Date   SHOULDER SURGERY Right     No current outpatient medications on file.   No current facility-administered medications for this visit.   Allergies:  Phenergan [promethazine hcl] and Latex   Social History: The patient  reports that he has never smoked. His smokeless tobacco use includes chew. He reports that he does not drink alcohol and does not use drugs.   Family History: The patient's family history includes CAD in his maternal grandfather; Thyroid disease in his mother.   ROS:  Please see the history of present illness. Otherwise, complete review of systems is positive for none.  All other systems are reviewed and negative.   Physical Exam: VS:  Ht 5\' 3"  (1.6 m)    Wt 204 lb (92.5 kg)   BMI 36.14 kg/m , BMI Body mass index is 36.14 kg/m.  Wt Readings from Last 3 Encounters:  05/02/22 204 lb (92.5 kg)  09/17/16 190 lb 12.8 oz (86.5 kg)  08/20/16 187 lb (84.8 kg)    General: Patient appears comfortable at rest. HEENT: Conjunctiva and lids normal, oropharynx clear with moist mucosa. Neck: Supple, no elevated JVP or carotid bruits, no thyromegaly. Lungs: Clear to auscultation, nonlabored breathing at rest. Cardiac: Regular rate and rhythm, no S3 or significant systolic murmur, no pericardial rub. Abdomen: Soft, nontender, no hepatomegaly, bowel sounds present, no guarding or rebound. Extremities: No pitting edema, distal pulses 2+. Skin: Warm and dry. Musculoskeletal: No kyphosis. Neuropsychiatric: Alert and oriented x3, affect grossly appropriate.  ECG:  An ECG dated 05/02/2022 was personally reviewed today and demonstrated:  NSR  Recent Labwork: No results found for requested labs within last 365 days.  No results found for: "CHOL", "TRIG", "HDL", "CHOLHDL", "VLDL", "LDLCALC", "LDLDIRECT"  Other Studies Reviewed Today:   Assessment and Plan: Patient is a 37 year old M with no PMH was referred to cardiology clinic for evaluation of palpitations.  # Palpitations -Palpitations has been going on for couple of years, occurs multiple times in a week, lasts for 5 to 10 minutes and resolves with deep breaths. Obtain 1 week, nonlive event monitor.  Does not drink coffee, energy drinks or take herbal supplements. -Follow-up with PCP for TSH and OSA evaluation  I have spent a total of 30 minutes with  patient reviewing chart, EKGs, labs and examining patient as well as establishing an assessment and plan that was discussed with the patient.  > 50% of time was spent in direct patient care.      Medication Adjustments/Labs and Tests Ordered: Current medicines are reviewed at length with the patient today.  Concerns regarding medicines are outlined  above.   Tests Ordered: Orders Placed This Encounter  Procedures   EKG 12-Lead    Medication Changes: No orders of the defined types were placed in this encounter.   Disposition:  Follow up prn  Signed De Jaworski Fidel Levy, MD, 05/02/2022 7:57 AM    [facility], [place], [place] [address]

## 2022-05-02 NOTE — Telephone Encounter (Signed)
7 Day ZIO XT/[place]

## 2022-05-25 ENCOUNTER — Telehealth: Payer: Self-pay

## 2022-05-25 MED ORDER — METOPROLOL TARTRATE 25 MG PO TABS: 25.0000 mg | ORAL_TABLET | Freq: Two times a day (BID) | ORAL | 1 refills | Status: DC

## 2022-05-25 NOTE — Telephone Encounter (Signed)
Results discussed with patient and he is agreeable to begin lopressor 25 mg BID, 90 day supply e-scribed to [facility].Results copied to pcp.

## 2022-05-25 NOTE — Telephone Encounter (Signed)
-----   Message from Marjo Bicker, MD sent at 05/23/2022  5:07 PM EDT ----- Event monitor is remarkable for sinus tachycardia, average HR 88 bpm. Symptoms correlated with NSR (95 to 140 bpm). Start metoprolol tartrate 25 mg twice daily for symptom relief.

## 2022-06-09 ENCOUNTER — Encounter: Payer: Self-pay | Admitting: Internal Medicine

## 2022-08-23 ENCOUNTER — Encounter: Payer: Self-pay | Admitting: Internal Medicine

## 2022-08-24 ENCOUNTER — Other Ambulatory Visit: Payer: Self-pay | Admitting: *Deleted

## 2022-08-24 MED ORDER — METOPROLOL TARTRATE 25 MG PO TABS: 37.5000 mg | ORAL_TABLET | Freq: Two times a day (BID) | ORAL | 1 refills | Status: DC

## 2023-06-22 ENCOUNTER — Encounter: Payer: Self-pay | Admitting: Internal Medicine

## 2023-08-10 ENCOUNTER — Other Ambulatory Visit: Payer: Self-pay | Admitting: Internal Medicine

## 2024-02-02 NOTE — Progress Notes (Unsigned)
" ° ° ° ° ° °  HPI:    PMH: Past Medical History:  Diagnosis Date   A-fib [facility])    PTSD (post-traumatic stress disorder)    TBI (traumatic brain injury) ([facility]) 2005    Surgical History: Past Surgical History:  Procedure Laterality Date   SHOULDER SURGERY Right     Home Medications:  Allergies as of 02/04/2024       Reactions   Phenergan [promethazine Hcl]    Throat swellling   Latex Rash        Medication List        Accurate as of February 02, 2024  7:36 PM. If you have any questions, ask your nurse or doctor.          metoprolol  tartrate 25 MG tablet Commonly known as: LOPRESSOR  TAKE 1 AND 1/2 TABLETS BY MOUTH TWICE DAILY        Allergies: Allergies[1]  Family History: Family History  Problem Relation Age of Onset   Thyroid disease Mother    CAD Maternal Grandfather        CABG x's 4    Social History:  reports that he has never smoked. His smokeless tobacco use includes chew. He reports that he does not drink alcohol and does not use drugs.  ROS: All other review of systems were reviewed and are negative except what is noted above in HPI  Physical Exam: There were no vitals taken for this visit.  Constitutional:  Alert and oriented, No acute distress. HEENT: [place] AT, moist mucus membranes.  Trachea midline, no masses. Cardiovascular: No clubbing, cyanosis, or edema. Respiratory: Normal respiratory effort, no increased work of breathing. GI: No inguinal hernias GU: Normal phallus. No masses/lesions on penis, testis, scrotum. Prostate ***g smooth no nodules no induration.  Lymph: No cervical or inguinal lymphadenopathy. Skin: No rashes, bruises or suspicious lesions. Neurologic: Grossly intact, no focal deficits, moving all 4 extremities. Psychiatric: Normal mood and affect.  Laboratory Data: No results found for: WBC, HGB, HCT, MCV, PLT  No results found for: CREATININE  No results found for: PSA  No results found for:  TESTOSTERONE  No results found for: HGBA1C  Urinalysis   Pertinent Imaging: ***  Assessment:    Plan:    There are no diagnoses linked to this encounter.  No follow-ups on file.  Garnette CHRISTELLA Shack, MD  [facility] [place]      [1]  Allergies Allergen Reactions   Phenergan [Promethazine Hcl]     Throat swellling   Latex Rash   "

## 2024-02-04 ENCOUNTER — Ambulatory Visit (INDEPENDENT_AMBULATORY_CARE_PROVIDER_SITE_OTHER): Admitting: Urology

## 2024-02-04 VITALS — BP 105/71 | HR 67

## 2024-02-04 DIAGNOSIS — Z3189 Encounter for other procreative management: Secondary | ICD-10-CM | POA: Diagnosis not present

## 2024-02-04 DIAGNOSIS — Z9852 Vasectomy status: Secondary | ICD-10-CM | POA: Diagnosis not present
